# Patient Record
Sex: Male | Born: 2000 | Race: White | Hispanic: No | Marital: Single
Health system: Midwestern US, Community
[De-identification: ages and names within clinical notes are randomized; demographics above are authoritative.]

## PROBLEM LIST (undated history)

## (undated) DIAGNOSIS — K5900 Constipation, unspecified: Secondary | ICD-10-CM

---
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
## 2023-06-06 IMAGING — MR MRI LUMBAR SPINE WITHOUT CONTRAST
5 of 9 series · 12 of 48 positions shown · IV contrast (gadolinium)
Comparison: None.

________________________________________________________________________________________________ 
MRI LUMBAR SPINE WITHOUT CONTRAST, 06/06/2023 [DATE]: 
CLINICAL INDICATION: Low back pain with radiation to bilateral legs.
TECHNIQUE: Multiplanar, multiecho position MR images of the lumbar spine were 
performed without intravenous gadolinium enhancement. Patient was scanned on a 
3T magnet

[Series 201: survey · axial · 10.0mm · 1.39mm/px · z∈[-159,+83]mm · 2 of 9 slices shown]
[im 1/9]
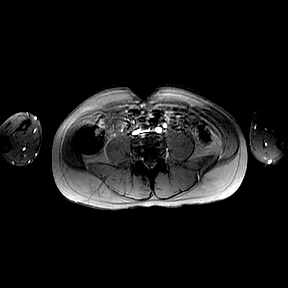
[im 9/9]
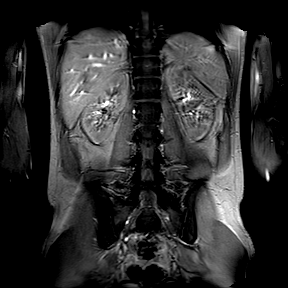

[Series 301: t2w_cor-surv · coronal · 6.0mm · 0.50mm/px · 2 of 5 slices shown]
[im 1/5]
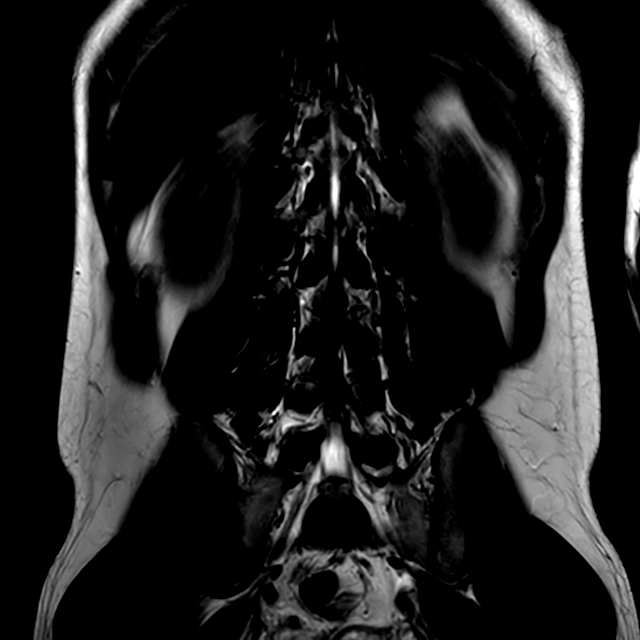
[im 5/5]
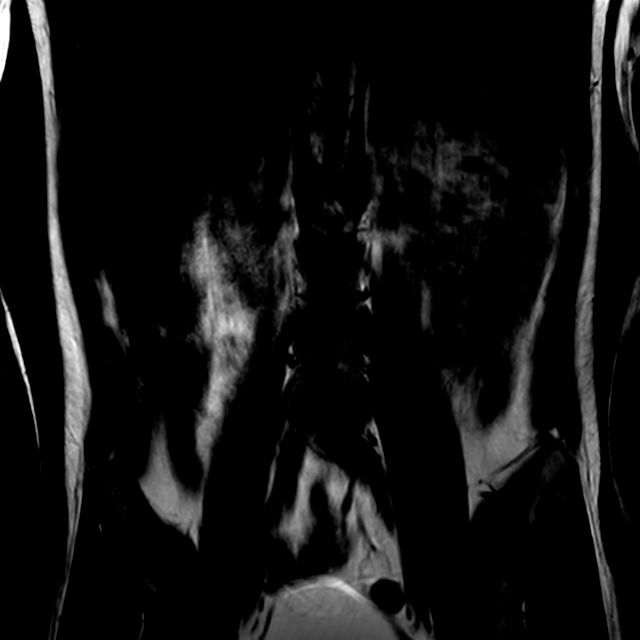

[Series 401: stir_sag-philips · sagittal · 4.0mm · 0.41mm/px · 2 of 17 slices shown]
[im 1/17]
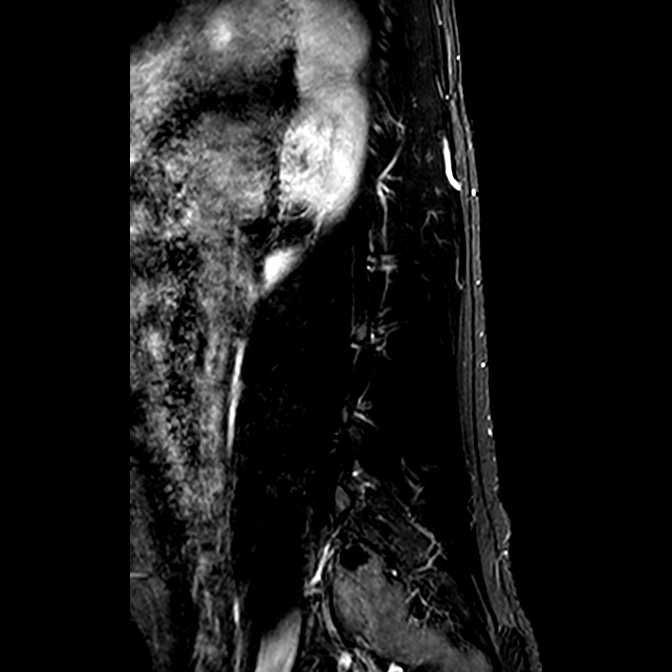
[im 17/17]
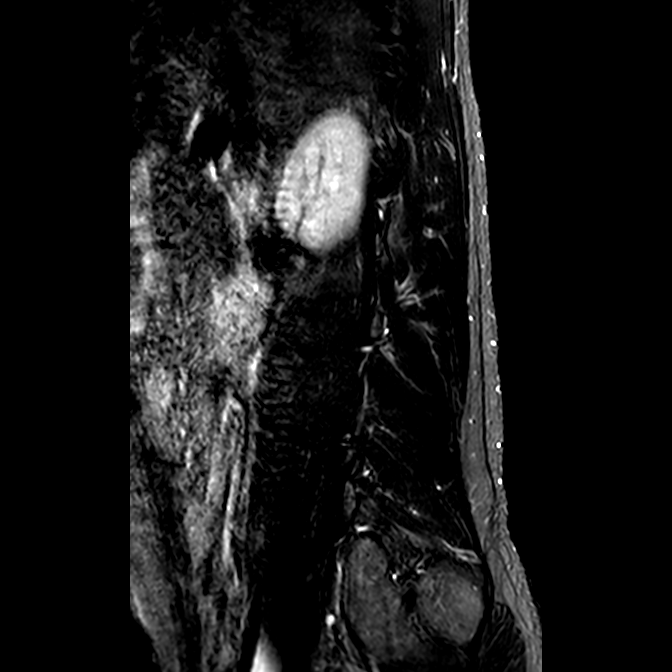

[Series 501: t1w_tse sag · sagittal · 4.0mm · 0.27mm/px · 1 of 17 slices shown]
[im 1/17]
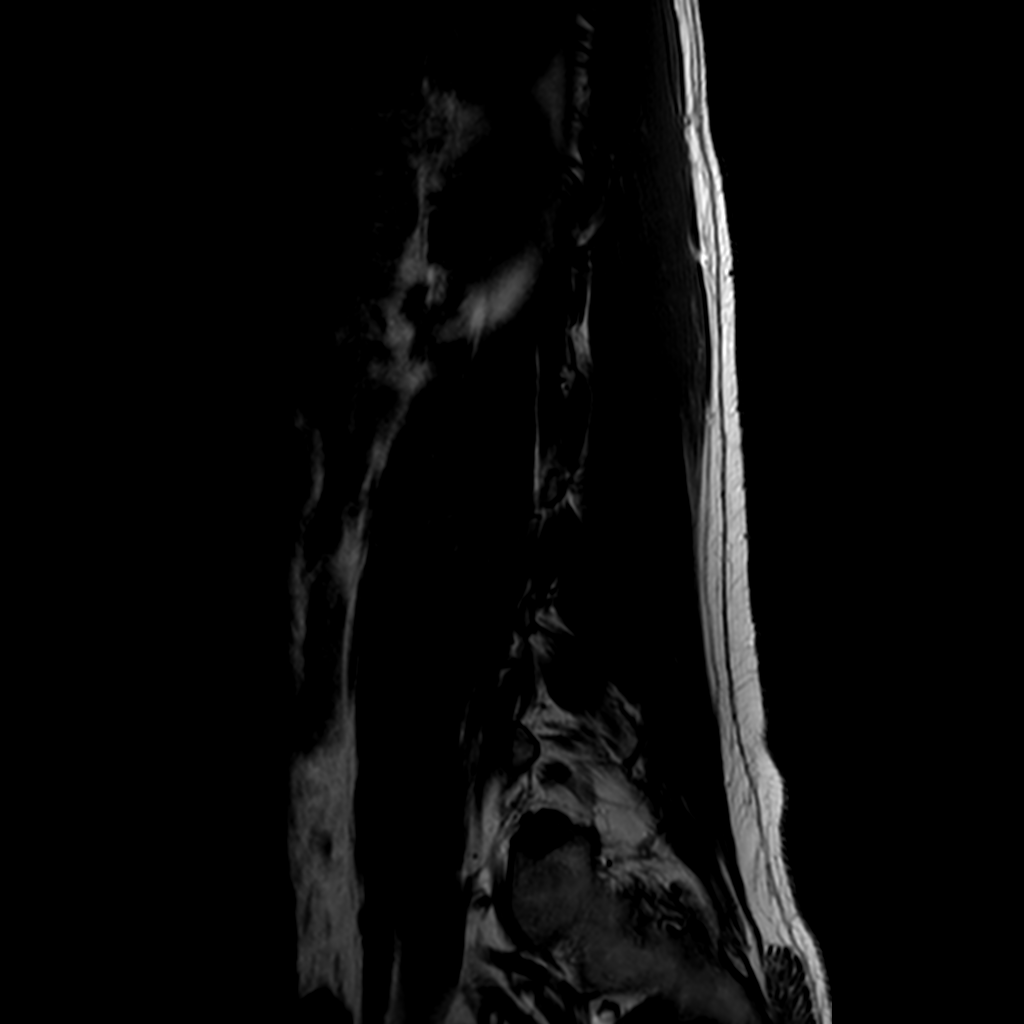

[Series 901: T1 · axial · 5.0mm · 0.35mm/px · z∈[-227,-11]mm · 5 of 37 slices shown]
[im 1/37]
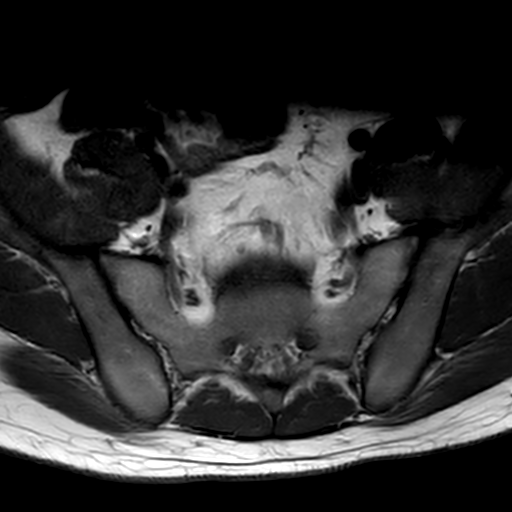
[im 10/37]
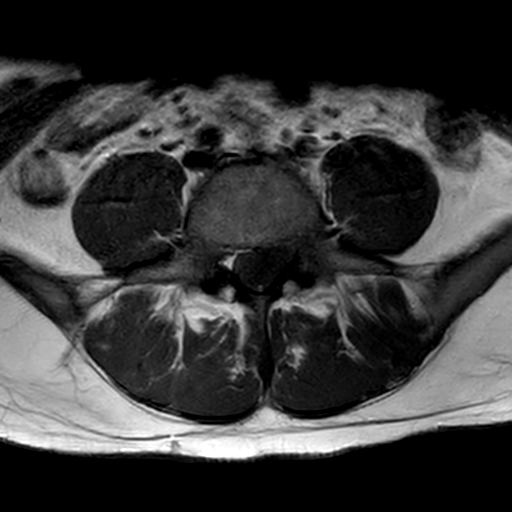
[im 19/37]
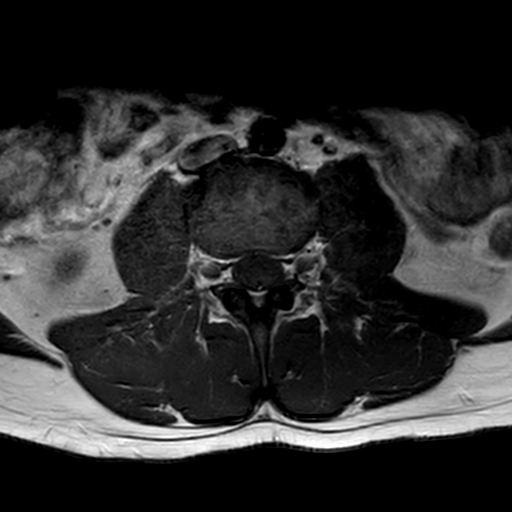
[im 28/37]
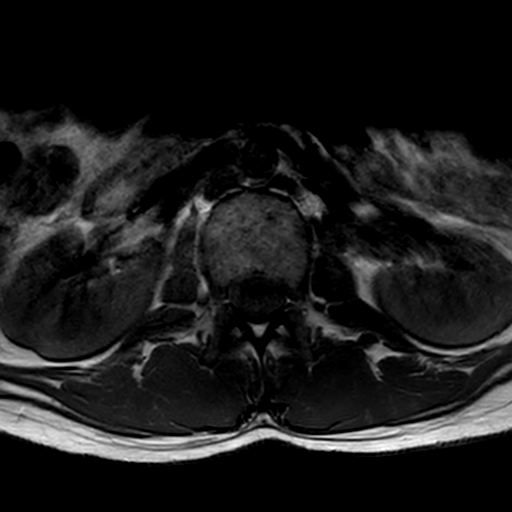
[im 37/37]
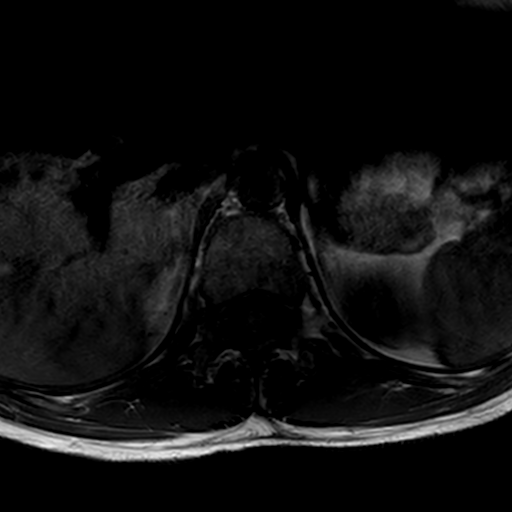

[12 of 48 positions shown; findings below may reference images not displayed]

FINDINGS: -------------------------------------------------------------------------------- 
------ 
GENERAL: 
Nomenclature is based on 5 lumbar type vertebral bodies.     
ALIGNMENT: Normal. 
VERTEBRAL BODY HEIGHT: Normal.  
MARROW SIGNAL: No focal suspect signal abnormality. 
CORD SIGNAL: Normal distal spinal cord and cauda equina.  Conus terminates at 
L2. 
ADDITIONAL FINDINGS: None. 
Modic I-II: None. 
Ligamentum Flavum > 2.5 mm: All levels. 
-------------------------------------------------------------------------------- 
------ 
SEGMENTAL: 
T12-L1: No significant central canal narrowing.  No significant right neural 
foraminal narrowing. No significant left neural foraminal narrowing.  
L1-L2: No significant central canal narrowing.  No significant right neural 
foraminal narrowing. No significant left neural foraminal narrowing.  
L2-L3: No significant central canal narrowing.  No significant right neural 
foraminal narrowing. No significant left neural foraminal narrowing.  
L3-L4: No significant central canal narrowing.  No significant right neural 
foraminal narrowing. No significant left neural foraminal narrowing.  
L4-L5: No significant central canal narrowing.  No significant right neural 
foraminal narrowing. No significant left neural foraminal narrowing.  
L5-S1: No significant central canal narrowing.  No significant right neural 
foraminal narrowing. No significant left neural foraminal narrowing.  
-------------------------------------------------------------------------------- 
------
IMPRESSION: Normal MRI lumbar spine.

## 2023-09-10 IMAGING — MR MRI THORACIC SPINE WITHOUT CONTRAST
4 of 9 series · 12 of 48 positions shown · non-contrast
Comparison: None

________________________________________________________________________________________________ 
MRI THORACIC SPINE WITHOUT CONTRAST, 09/10/2023 [DATE]: 
CLINICAL INDICATION: Pain in thoracic spine
TECHNIQUE: Multiplanar, multiecho position MR images of the thoracic spine were 
performed without contrast. Patient was scanned on a 1.5T magnet.

[Series 101: t2_sag_count · sagittal · 4.0mm · 0.62mm/px · 4 of 22 slices shown]
[im 1/22]
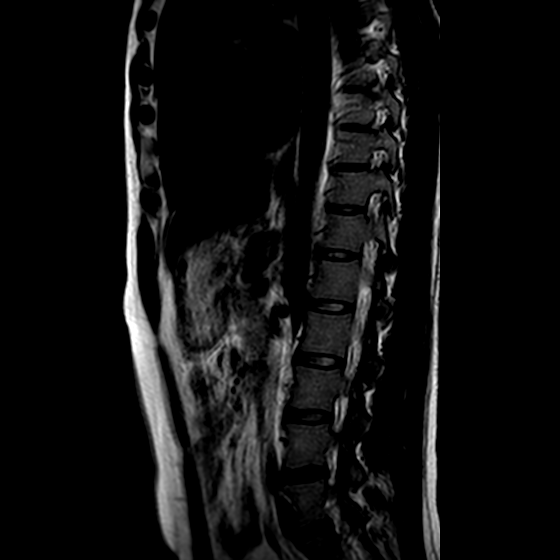
[im 8/22]
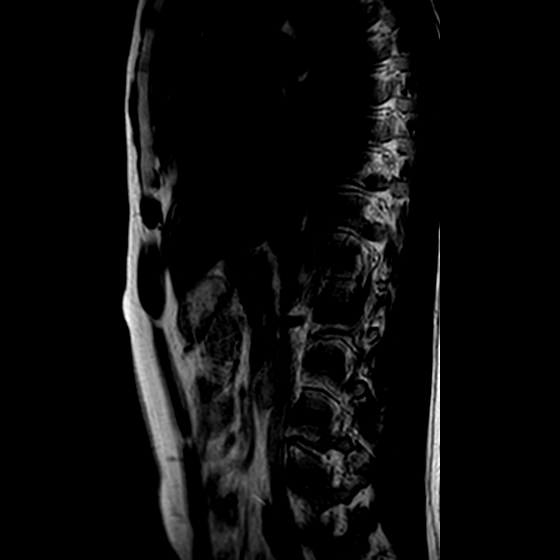
[im 15/22]
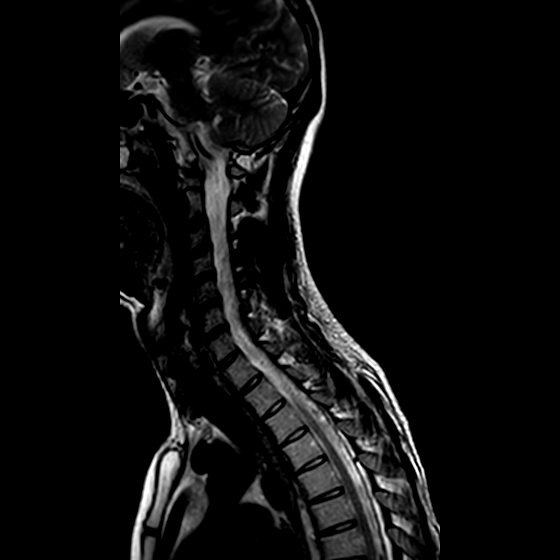
[im 22/22]
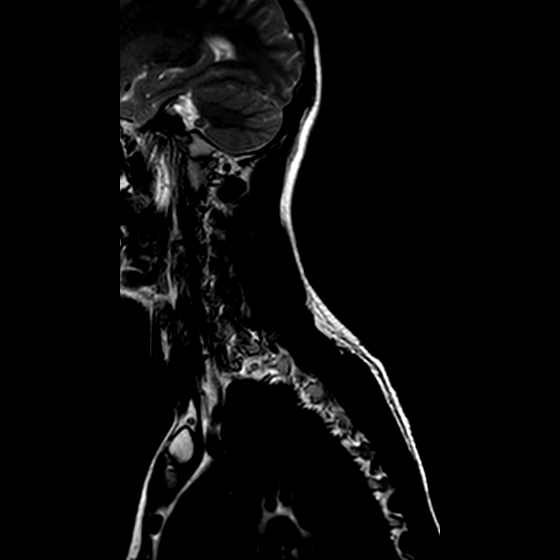

[Series 102: T2 · sagittal · 4.0mm · 0.62mm/px · 2 of 11 slices shown]
[im 1/11]
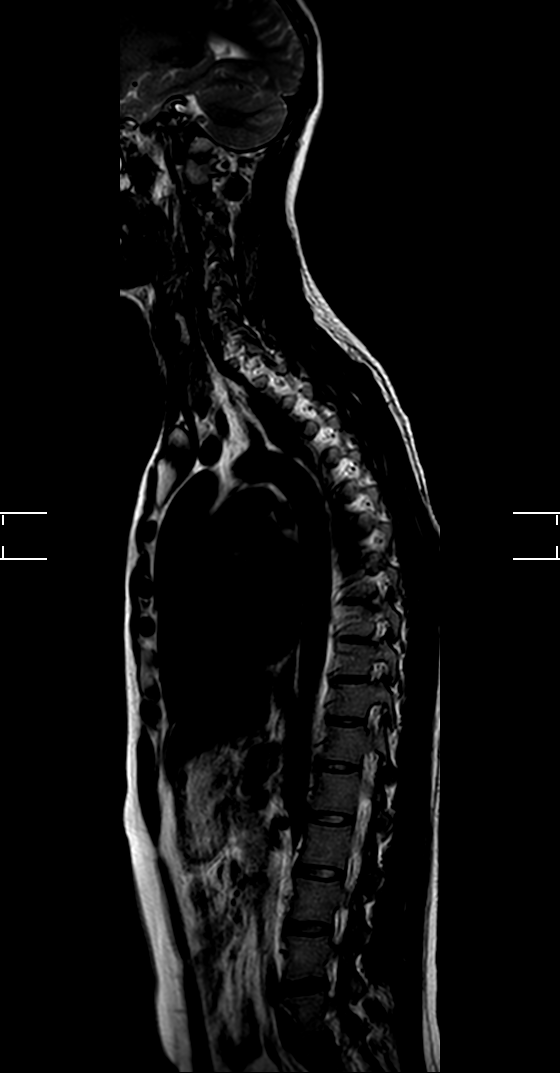
[im 11/11]
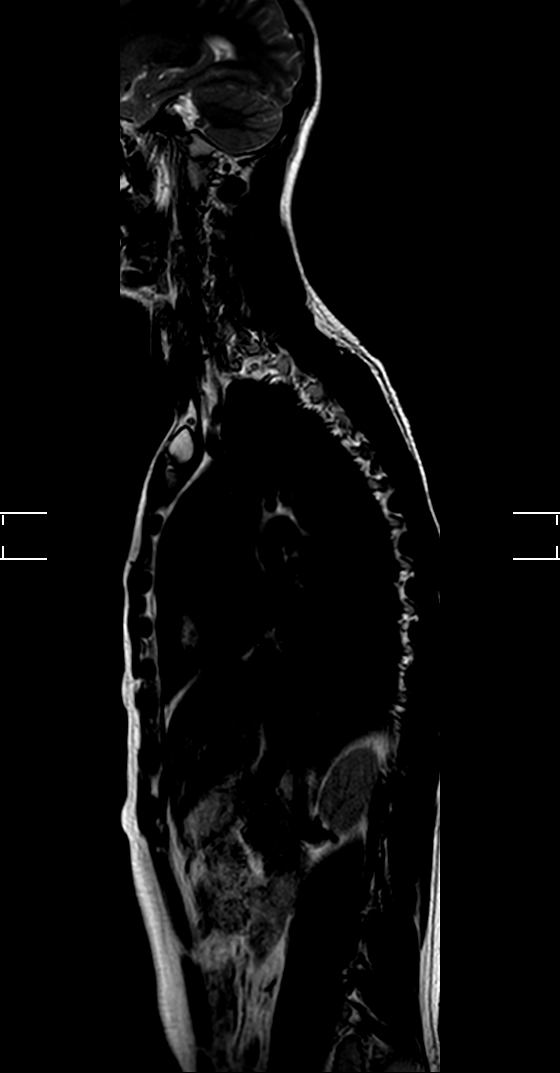

[Series 201: t2_cor_count · coronal · 4.0mm · 0.61mm/px · 4 of 28 slices shown]
[im 1/28]
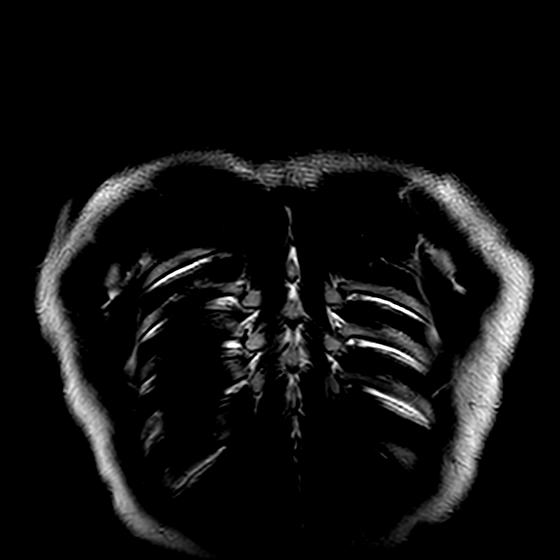
[im 10/28]
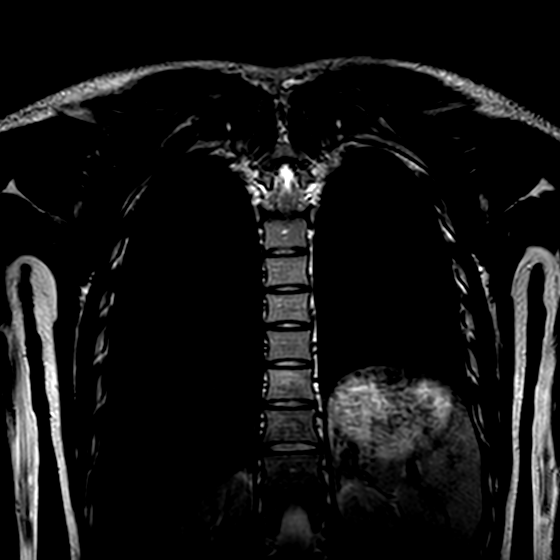
[im 19/28]
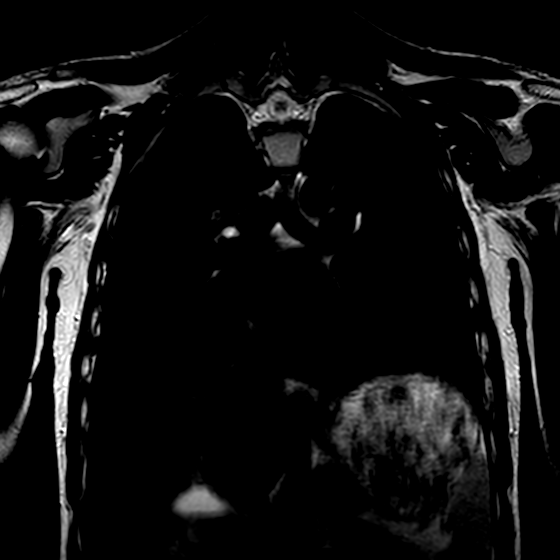
[im 28/28]
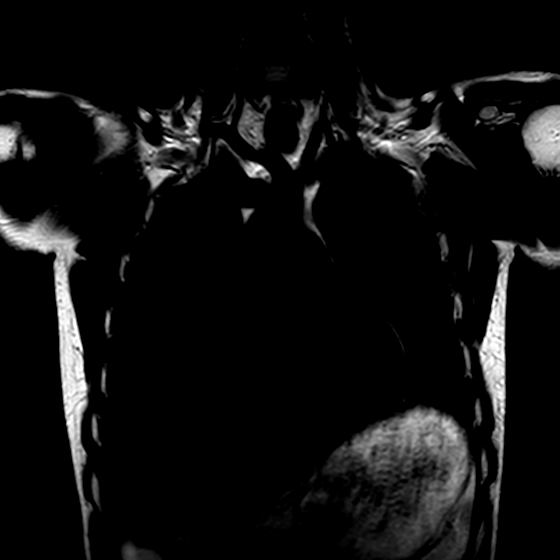

[Series 302: (id)_mdixon_tse · sagittal · 4.0mm · 0.36mm/px · 2 of 17 slices shown]
[im 1/17]
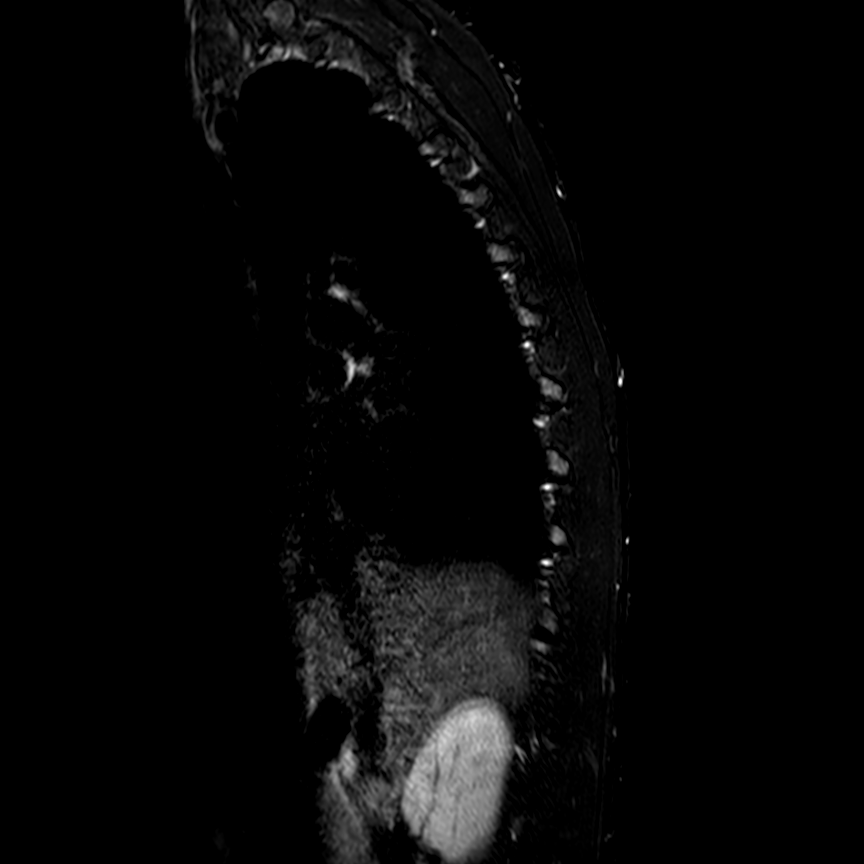
[im 17/17]
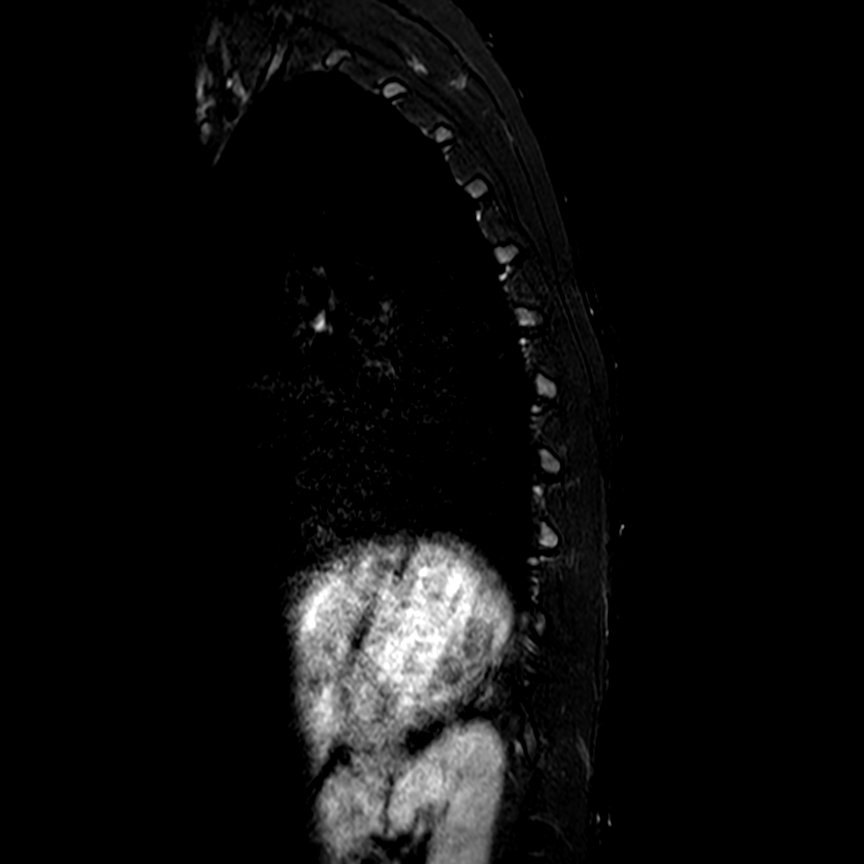

[12 of 48 positions shown; findings below may reference images not displayed]

FINDINGS: VERTEBRA: No fractures or marrow replacing lesions. No central canal or neural 
foraminal stenosis.  
DISCS: Minimal narrowing of the T6-7/T7-8 disc spaces. No disc protrusions or 
evidence of discitis.  
SPINAL CORD: Spinal cord is normal in caliber and signal intensity. Conus 
medullaris is at the level of L1.  
OTHER: Localizer views demonstrate degenerative change of the cervical spine: 
Please refer to 06/06/2023 cervical and lumbar spine MRIs.
IMPRESSION: Minimal narrowing of the T6-7/T7-8 disc spaces.
